# Patient Record
Sex: Male | Born: 1960 | Race: White | Hispanic: No | Marital: Married | Smoking: Former smoker
Health system: Southern US, Community
[De-identification: ages and names within clinical notes are randomized; demographics above are authoritative.]

## PROBLEM LIST (undated history)

## (undated) DIAGNOSIS — K219 Gastro-esophageal reflux disease without esophagitis: Secondary | ICD-10-CM

## (undated) DIAGNOSIS — E7800 Pure hypercholesterolemia, unspecified: Secondary | ICD-10-CM

## (undated) DIAGNOSIS — E669 Obesity, unspecified: Secondary | ICD-10-CM

## (undated) DIAGNOSIS — I10 Essential (primary) hypertension: Secondary | ICD-10-CM

## (undated) DIAGNOSIS — R079 Chest pain, unspecified: Secondary | ICD-10-CM

## (undated) DIAGNOSIS — E785 Hyperlipidemia, unspecified: Secondary | ICD-10-CM

## (undated) HISTORY — DX: Obesity, unspecified: E66.9

## (undated) HISTORY — PX: TONSILLECTOMY: SUR1361

## (undated) HISTORY — PX: HERNIA REPAIR: SHX51

## (undated) HISTORY — DX: Chest pain, unspecified: R07.9

## (undated) HISTORY — PX: TYMPANOSTOMY TUBE PLACEMENT: SHX32

## (undated) HISTORY — PX: CORONARY ARTERY BYPASS GRAFT: SHX141

## (undated) HISTORY — DX: Hyperlipidemia, unspecified: E78.5

## (undated) HISTORY — DX: Gastro-esophageal reflux disease without esophagitis: K21.9

---

## 1998-02-07 ENCOUNTER — Ambulatory Visit: Admission: RE | Admit: 1998-02-07 | Discharge: 1998-02-07 | Payer: Self-pay

## 1998-05-13 ENCOUNTER — Ambulatory Visit (HOSPITAL_BASED_OUTPATIENT_CLINIC_OR_DEPARTMENT_OTHER): Admission: RE | Admit: 1998-05-13 | Discharge: 1998-05-13 | Payer: Self-pay | Admitting: Otolaryngology

## 1999-04-20 ENCOUNTER — Encounter: Payer: Self-pay | Admitting: Cardiovascular Disease

## 1999-04-20 ENCOUNTER — Ambulatory Visit (HOSPITAL_COMMUNITY): Admission: RE | Admit: 1999-04-20 | Discharge: 1999-04-21 | Payer: Self-pay | Admitting: Cardiovascular Disease

## 1999-09-04 HISTORY — PX: CARDIAC CATHETERIZATION: SHX172

## 1999-09-14 ENCOUNTER — Inpatient Hospital Stay (HOSPITAL_COMMUNITY): Admission: EM | Admit: 1999-09-14 | Discharge: 1999-09-17 | Payer: Self-pay | Admitting: Emergency Medicine

## 1999-09-14 ENCOUNTER — Encounter: Payer: Self-pay | Admitting: Emergency Medicine

## 1999-09-15 ENCOUNTER — Encounter: Payer: Self-pay | Admitting: Cardiology

## 2002-04-03 ENCOUNTER — Encounter: Admission: RE | Admit: 2002-04-03 | Discharge: 2002-04-03 | Payer: Self-pay | Admitting: Internal Medicine

## 2002-04-03 ENCOUNTER — Encounter: Payer: Self-pay | Admitting: Internal Medicine

## 2002-12-08 ENCOUNTER — Emergency Department (HOSPITAL_COMMUNITY): Admission: AD | Admit: 2002-12-08 | Discharge: 2002-12-08 | Payer: Self-pay | Admitting: Family Medicine

## 2005-08-09 ENCOUNTER — Ambulatory Visit: Payer: Self-pay | Admitting: Internal Medicine

## 2006-05-17 ENCOUNTER — Ambulatory Visit: Payer: Self-pay | Admitting: Internal Medicine

## 2006-11-20 ENCOUNTER — Encounter: Payer: Self-pay | Admitting: Internal Medicine

## 2007-07-01 ENCOUNTER — Observation Stay (HOSPITAL_COMMUNITY): Admission: EM | Admit: 2007-07-01 | Discharge: 2007-07-03 | Payer: Self-pay | Admitting: Surgical Oncology

## 2007-07-19 ENCOUNTER — Encounter: Payer: Self-pay | Admitting: Internal Medicine

## 2007-07-31 ENCOUNTER — Ambulatory Visit: Payer: Self-pay | Admitting: Internal Medicine

## 2007-07-31 DIAGNOSIS — I2510 Atherosclerotic heart disease of native coronary artery without angina pectoris: Secondary | ICD-10-CM | POA: Insufficient documentation

## 2007-07-31 DIAGNOSIS — F411 Generalized anxiety disorder: Secondary | ICD-10-CM | POA: Insufficient documentation

## 2007-07-31 DIAGNOSIS — E785 Hyperlipidemia, unspecified: Secondary | ICD-10-CM

## 2007-07-31 DIAGNOSIS — M25519 Pain in unspecified shoulder: Secondary | ICD-10-CM

## 2007-07-31 DIAGNOSIS — I10 Essential (primary) hypertension: Secondary | ICD-10-CM | POA: Insufficient documentation

## 2007-08-02 ENCOUNTER — Telehealth: Payer: Self-pay | Admitting: Internal Medicine

## 2008-01-30 ENCOUNTER — Ambulatory Visit: Payer: Self-pay | Admitting: Internal Medicine

## 2008-01-30 LAB — CONVERTED CEMR LAB
ALT: 23 units/L (ref 0–53)
Albumin: 3.7 g/dL (ref 3.5–5.2)
Basophils Relative: 0 % (ref 0.0–3.0)
Bilirubin Urine: NEGATIVE
CO2: 27 meq/L (ref 19–32)
Calcium: 9 mg/dL (ref 8.4–10.5)
Creatinine, Ser: 0.9 mg/dL (ref 0.4–1.5)
GFR calc Af Amer: 116 mL/min
Glucose, Bld: 107 mg/dL — ABNORMAL HIGH (ref 70–99)
HCT: 47.1 % (ref 39.0–52.0)
HDL: 39.8 mg/dL (ref >39.0)
Hemoglobin: 16 g/dL (ref 13.0–17.0)
Ketones, ur: NEGATIVE mg/dL
Leukocytes, UA: NEGATIVE
Lymphocytes Relative: 34.8 % (ref 12.0–46.0)
MCHC: 34 g/dL (ref 30.0–36.0)
Monocytes Absolute: 0.7 10*3/uL (ref 0.1–1.0)
Monocytes Relative: 12.9 % — ABNORMAL HIGH (ref 3.0–12.0)
Neutro Abs: 2.6 10*3/uL (ref 1.4–7.7)
Nitrite: NEGATIVE
RBC: 5.16 M/uL (ref 4.22–5.81)
Specific Gravity, Urine: 1.025 (ref 1.000–1.03)
TSH: 1.97 microintl units/mL (ref 0.35–5.50)
Total CHOL/HDL Ratio: 2.9
Total Protein, Urine: NEGATIVE mg/dL
Total Protein: 6.3 g/dL (ref 6.0–8.3)
Triglycerides: 60 mg/dL (ref 0–149)
pH: 5.5 (ref 5.0–8.0)

## 2008-02-11 ENCOUNTER — Ambulatory Visit: Payer: Self-pay | Admitting: Internal Medicine

## 2008-02-11 DIAGNOSIS — K649 Unspecified hemorrhoids: Secondary | ICD-10-CM | POA: Insufficient documentation

## 2008-02-11 DIAGNOSIS — K921 Melena: Secondary | ICD-10-CM

## 2008-05-02 ENCOUNTER — Ambulatory Visit: Payer: Self-pay | Admitting: Gastroenterology

## 2008-05-12 ENCOUNTER — Ambulatory Visit: Payer: Self-pay | Admitting: Gastroenterology

## 2008-05-12 ENCOUNTER — Encounter: Payer: Self-pay | Admitting: Gastroenterology

## 2008-05-14 ENCOUNTER — Encounter: Payer: Self-pay | Admitting: Gastroenterology

## 2008-07-08 ENCOUNTER — Telehealth: Payer: Self-pay | Admitting: Internal Medicine

## 2008-09-22 ENCOUNTER — Ambulatory Visit: Payer: Self-pay | Admitting: Family Medicine

## 2008-09-22 DIAGNOSIS — R079 Chest pain, unspecified: Secondary | ICD-10-CM | POA: Insufficient documentation

## 2008-09-24 ENCOUNTER — Encounter: Payer: Self-pay | Admitting: Internal Medicine

## 2008-10-07 ENCOUNTER — Encounter: Payer: Self-pay | Admitting: Internal Medicine

## 2009-03-27 ENCOUNTER — Telehealth: Payer: Self-pay | Admitting: Internal Medicine

## 2009-05-27 ENCOUNTER — Encounter: Admission: RE | Admit: 2009-05-27 | Discharge: 2009-05-27 | Payer: Self-pay | Admitting: Sports Medicine

## 2009-06-03 ENCOUNTER — Encounter: Admission: RE | Admit: 2009-06-03 | Discharge: 2009-09-01 | Payer: Self-pay | Admitting: Sports Medicine

## 2009-09-22 ENCOUNTER — Encounter: Payer: Self-pay | Admitting: Internal Medicine

## 2010-01-01 ENCOUNTER — Ambulatory Visit
Admission: RE | Admit: 2010-01-01 | Discharge: 2010-01-01 | Payer: Self-pay | Source: Home / Self Care | Attending: Internal Medicine | Admitting: Internal Medicine

## 2010-01-01 DIAGNOSIS — M545 Low back pain: Secondary | ICD-10-CM

## 2010-01-25 ENCOUNTER — Ambulatory Visit: Admit: 2010-01-25 | Payer: Self-pay | Admitting: Sports Medicine

## 2010-02-04 NOTE — Progress Notes (Signed)
Summary: lorazepam  Phone Note Other Incoming Call back at fax   [phone] Summary of Call: request refill for lorazepam ----  will give 1 refill needs office visit not seen DR. Plotnikov since 02/2008/vg  Initial call taken by: Tora Perches,  March 27, 2009 2:08 PM    Prescriptions: LORAZEPAM 1 MG  TABS (LORAZEPAM) 1 by mouth two times a day as needed pain  #60 x 1   Entered by:   Tora Perches   Authorized by:   Tresa Garter MD   Signed by:   Tora Perches on 03/27/2009   Method used:   Telephoned to ...       [facility]  [address] [phone][facility] (retail)       [address] [place], [place]  [address]       Ph: [phone] or [phone]       Fax: [phone]   RxID:   [id number]

## 2010-02-04 NOTE — Assessment & Plan Note (Signed)
Summary: low back pain/cd   Vital Signs:  Patient profile:   50 year old male Height:      74 inches Weight:      296 pounds BMI:     38.14 Temp:     98.8 degrees F oral Pulse rate:   84 / minute Pulse rhythm:   regular Resp:     16 per minute BP sitting:   120 / 70  (left arm) Cuff size:   regular  Vitals Entered By: Lanier Prude, CMA(AAMA) (January 01, 2010 11:25 AM) CC: neck and Rt side LBP X 1 wk Is Patient Diabetic? No Comments pt is not taking Lortab   CC:  neck and Rt side LBP X 1 wk.  History of Present Illness: C/o B shoulder pain whe externaly rotates his shoulders (xrays were OK this past summer) C/o occasional LBP since last Fri night when he was playing hockey 7/10, now 3-4/10 - R buttock is tightening up. F/u CAD  - he is wondering if Simvastatin aggravating pains  Preventive Screening-Counseling & Management  Caffeine-Diet-Exercise     Does Patient Exercise: yes  Current Medications (verified): 1)  Aspirin 325 Mg  Tabs (Aspirin) .... Once Daily 2)  Vytorin 10-80 Mg  Tabs (Ezetimibe-Simvastatin) .... Once Daily 3)  Niaspan 1000 Mg  Cr-Tabs (Niacin (Antihyperlipidemic)) .... Once Daily 4)  Metoprolol Tartrate 50 Mg Tabs (Metoprolol Tartrate) .... Once Daily 5)  Vitamin D3 1000 Unit  Tabs (Cholecalciferol) .Marland Kitchen.. 1 By Mouth Daily 6)  Lorazepam 1 Mg  Tabs (Lorazepam) .Marland Kitchen.. 1 By Mouth Two Times A Day As Needed Pain 7)  Lortab 7.5 7.5-500 Mg Tabs (Hydrocodone-Acetaminophen) .Marland Kitchen.. 1 By Mouth Hs As Needed Pain  Allergies (verified): No Known Drug Allergies  Past History:  Past Medical History: Last updated: 02/11/2008 Anxiety Coronary artery disease, last cath 06/2007 Hyperlipidemia Hypertension Wife w/gen. 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    Physical Exam  General:  Well-developed,well-nourished,in no acute distress; alert,appropriate and cooperative throughout examination Head:  Normocephalic and atraumatic without obvious abnormalities. No apparent alopecia or balding. Mouth:  Oral mucosa and oropharynx without lesions or exudates.  Teeth in good repair. Lungs:  Normal respiratory effort, chest expands symmetrically. Lungs are clear to auscultation, no crackles or wheezes. Heart:  Normal rate and regular rhythm. S1 and S2 normal without gallop, murmur, click, rub or other extra sounds. Abdomen:  Bowel sounds positive,abdomen soft and non-tender without masses, organomegaly or hernias noted. Msk:  No deformity or scoliosis noted of thoracic or lumbar spine.  Cerv spine is ok. B shoulder with subacr pain when abducting and on ext rotating R glut is tender w/ROM and palp Neurologic:  No cranial nerve deficits noted. Station and gait are normal. Plantar reflexes are down-going bilaterally. DTRs are symmetrical throughout. Sensory, motor and coordinative functions appear intact. Skin:  Intact without suspicious lesions or rashes Psych:  Cognition and judgment appear intact. Alert and cooperative with normal attention span and concentration. No apparent delusions, illusions, hallucinations   Impression & Recommendations:  Problem # 1:  LOW BACK PAIN, ACUTE (ICD-724.2) R Assessment  New  The following medications were removed from the medication list:    Lortab 7.5 7.5-500 Mg Tabs (Hydrocodone-acetaminophen) .Marland Kitchen... 1 by mouth hs as needed pain His updated medication list for this problem includes:    Aspirin 325 Mg Tabs (Aspirin) ..... Once daily    Ibuprofen 600 Mg Tabs (Ibuprofen)  .Marland Kitchen... 1 by mouth bid  pc x 1 wk then as needed for  pain    Tramadol Hcl 50 Mg Tabs (Tramadol hcl) .Marland Kitchen... 1-2 tabs by mouth two times a day as needed pain  Orders: Sports Medicine (Sports Med)  Problem # 2:  SHOULDER PAIN (ICD-719.41) B Assessment: New  The following medications were removed from the medication list:    Lortab 7.5 7.5-500 Mg Tabs (Hydrocodone-acetaminophen) .Marland Kitchen... 1 by mouth hs as needed pain His updated medication list for this problem includes:    Aspirin 325 Mg Tabs (Aspirin) ..... Once daily    Ibuprofen 600 Mg Tabs (Ibuprofen) .Marland Kitchen... 1 by mouth bid  pc x 1 wk then as needed for  pain    Tramadol Hcl 50 Mg Tabs (Tramadol hcl) .Marland Kitchen... 1-2 tabs by mouth two times a day as needed pain  Orders: Sports Medicine (Sports Med)  Problem # 3:  HYPERLIPIDEMIA (ICD-272.4) Assessment: Comment Only  The following medications were removed from the medication list:    Vytorin 10-80 Mg Tabs (Ezetimibe-simvastatin) ..... Once daily His updated medication list for this problem includes:    Niaspan 1000 Mg Cr-tabs (Niacin (antihyperlipidemic)) ..... Once daily    Lipitor 40 Mg Tabs (Atorvastatin calcium) .Marland Kitchen... 1 by mouth once daily for cholesterol  Problem # 4:  CORONARY ARTERY DISEASE (ICD-414.00) Assessment: Unchanged  His updated medication list for this problem includes:    Aspirin 325 Mg Tabs (Aspirin) ..... Once daily    Metoprolol Tartrate 50 Mg Tabs (Metoprolol tartrate) ..... Once daily  Complete Medication List: 1)  Aspirin 325 Mg Tabs (Aspirin) .... Once daily 2)  Niaspan 1000 Mg Cr-tabs (Niacin (antihyperlipidemic)) .... Once daily 3)  Metoprolol Tartrate 50 Mg Tabs (Metoprolol tartrate) .... Once daily 4)  Vitamin D3 1000 Unit Tabs (Cholecalciferol) .Marland Kitchen.. 1 by mouth daily 5)  Lorazepam 1 Mg Tabs (Lorazepam) .Marland Kitchen.. 1 by mouth two times a day as needed pain 6)  Ibuprofen 600 Mg Tabs (Ibuprofen) .Marland Kitchen.. 1 by mouth bid  pc x 1 wk then as needed for  pain 7)  Tramadol Hcl 50  Mg Tabs (Tramadol hcl) .Marland Kitchen.. 1-2 tabs by mouth two times a day as needed pain 8)  Lipitor 40 Mg Tabs (Atorvastatin calcium) .Marland Kitchen.. 1 by mouth once daily for cholesterol  Patient Instructions: 1)  Go on Youtube (www.youtube.com) and look up "piriformis stretch" and "gluteus stretch"; also look up "rotator cuff strain". See the anatomy and learn the symptoms.  You can try to self-diagnose.Do the stretches - it may help!  2)  Please schedule a follow-up appointment in 6 weeks. Prescriptions: LIPITOR 40 MG TABS (ATORVASTATIN CALCIUM) 1 by mouth once daily for cholesterol  #30 x 12   Entered and Authorized by:   Tresa Garter MD   Signed by:   Tresa Garter MD on 01/01/2010   Method used:   [facility]        [facility] [phone][facility] (retail)       [address]       [place], [place]  [address]       Ph: [phone] or [phone]  Fax: [phone]   RxID:   [id number] TRAMADOL HCL 50 MG TABS (TRAMADOL HCL) 1-2 tabs by mouth two times a day as needed pain  #60 x 3   Entered and Authorized by:   Tresa Garter MD   Signed by:   Tresa Garter MD on 01/01/2010   Method used:   [facility]ically [facility]        [facility]  [address] [phone]* (retail)       [address]       [place], [place]  [address]       Ph: [phone] or [phone]       Fax: [phone]   RxID:   [id number] IBUPROFEN 600 MG TABS (IBUPROFEN) 1 by mouth bid  pc x 1 wk then as needed for  pain  #60 x 3   Entered and Authorized by:   Tresa Garter MD   Signed by:   Tresa Garter MD on 01/01/2010   Method used:   [facility]        [facility] [phone]* (retail)       [address]       [place], [place]  [address]       Ph: [phone] or [phone]       Fax: [phone]   RxID:   [id number]    Orders Added: 1)  Sports Medicine [Sports Med] 2)  Est. Patient Level IV [[address]]

## 2010-02-05 NOTE — Letter (Signed)
Summary: [facility]   Imported By: Sherian Rein 09/29/2009 15:00:56  _____________________________________________________________________  External Attachment:    Type:   Image     Comment:   External Document

## 2010-05-18 NOTE — Cardiovascular Report (Signed)
NAME:  MICK, TANGUMA NO.:  [id number]   MEDICAL RECORD NO.:  [id number]          PATIENT TYPE:  INP   LOCATION:  2920                         FACILITY:  [facility]   PHYSICIAN:  Richard A. Alanda Amass, M.D.DATE OF BIRTH:  1960-10-19   DATE OF PROCEDURE:  07/02/2007  DATE OF DISCHARGE:                            CARDIAC CATHETERIZATION   PROCEDURE:  Retrograde central aortic catheterization, selective  coronary angiography via Judkins technique, LV angiogram RAO/LAO  projection, saphenous vein graft angiography, selective LIMA, selective  RIMA, abdominal aortic angiogram midstream PA projection.   PROCEDURE:  The patient was brought to the second floor of [facility] in a  postabsorptive state after 5 mg Valium p.o. premedication.  The right  groin was prepped and draped in usual manner.  Xylocaine 1% was used for  local anesthesia and CRFA was entered with a single arterial puncture  using an 18-gauge thin-wall needle and 6-French short sidearm sheath  inserted without difficulty.  Diagnostic coronary angiography was done  with 6-French 4-cm taper preformed Cordis coronary and pigtail  catheters.  Saphenous vein angiography was done with the right coronary  catheter, selective RIMA of the intact RIMA was done with right coronary  catheter, selective LIMA was done with the right coronary catheter.  LV  angiogram was done in the RAO/LAO projection with pigtail catheter 25 mL  and 14 mL injected and 20 mL and 12 mL per second of nonionic high  osmolar contrast.  Pullback pressure to the CA was performed.  It showed  no gradient across the aortic valve.  Catheter was brought down about  the level of renal arteries and abdominal aortic angiogram was done in  the midstream PA projection at 20 mL and 20 mL per second with DSA  imaging.  Catheter was removed, sidearm sheath was flushed.  The patient  was transferred to the holding area for sheath removal with pressure  hemostasis.   He tolerated the procedure well and had intact right lower  extremity pulses on transfer.   PRESSURES:  LV:  140 5/6; LVEDP 18-20 mmHg.   CA:  145/80 mmHg.   There is no gradient across the aortic valve on catheter pullback.   LV angiogram in the RAO and LAO projection showed angiographic LVH of at  least a moderate degree, no mitral regurgitation and no segmental wall  motion abnormality, EF approximately 60% or greater.   Abdominal aortic angiogram in the midstream PA projection showed single  normal renal arteries, smooth infrarenal abdominal aorta with no  significant calcification, aneurysm, or obstruction.  Some iliacs were  widely patent bilaterally with no significant stenosis in the apex  appeared intact.   Fluoroscopy showed 2+ left and right coronary calcification.   The native coronary angiography:  Main left coronary was relatively  small, but normal.   The LAD had a 90% stenosis after the SP1 in its proximal third.  This  was followed by a small DX1 branch that were antegrade and bifurcated.  The LAD was functionally occluded beyond the DX1 to the junction of the  proximal mid third.  There was a small relatively thin optional diagonal branch that had 60-  70% proximal narrowing, but good flow and was a 2-mm type vessel.   Circumflex artery had a very small OM1 branch.  Two previously occluded  and distal circumflex bifurcated.   The native right coronary was a dominant vessel.  It was evident that  there was a high bifurcation of the PLA and PDA before the acute margin.  There was segmental 80%, focal 95%, and 90% stenosis of the proximal  third across the RA branch in the proximal RCA.  The remainder of vessel  had irregularities.  The PLA was widely patent and bifurcated and PDA  was seen along with retrograde filling of the RIMA graft.   The intact RIMA was widely patent and an excellent anastomosis to the  PDA, which had filled well.  There was no  backfilling of the PLA on  selective injection.   The LIMA was widely patent with excellent anastomosis to the mid LAD and  filled the LAD at the apex of the heart where it bifurcated.  There was  irregularity of the LAD, but no high-grade stenosis.  There was good  flow.   The saphenous vein graft to the large distal bifurcating marginal branch  was widely patent tortuous and there was a marked bend before the  anastomosis.  The area of prior POBA in 2001 was widely patent with  approximately 30% narrowing and good flow to the large bifurcating PDA.   DISCUSSION:  This 50 year old white married father of 2 works full-time  for [employer] in [employer].  He had remote CABG in 1996 x3 with intact LIMA,  RIMA and one vein graft to the OM.  He has done well long-term.  In  2001, he was restarted by Dr. Jenne Campus and he was felt to have a high-  grade lesion just after the insertion of the saphenous vein graft to the  marginal branch.  Attempts at passing the stent around the distal vein  graft because of its extreme then were not successful so POBA was done  of a 70-80% region.  The patient has done well since then and was  admitted on July 01, 2007, with prolonged substernal burning and  tingling in his left arm, no definite chest pain, but similar to the way  he felt in 1996.  Myocardial infarction was ruled out by serial enzymes  and EKGs.  He had exogenous obesity quit smoking in 1996 and since then  it has been under good control.  Negative Cardiolite for ischemia as an  outpatient in November 2008.   His anatomy appears relatively unchanged from previous angiography of  2001 except for some possible progression of his proximal RCA disease.  He does have jeopardized DX1 which is a relatively small branch from the  proximal LAD lesion.  He has progression of disease of the proximal vein  graft to the RCA, but a widely patent RIMA to the PDA and this graft  appears widely patent now and it was  described in the past as a critics  so may have opened up since he has had progression of disease of his  native right.  The POBA site of the DX anastomosis is intact.   I would recommend continued medical therapy for this patient and PPI to  his regimen.  Case was reviewed with Dr. Clarene Duke and I will try to review  this case with his primary cardiologist, Dr. Christiane Ha  Berry as well.   CATHETERIZATION DIAGNOSIS:  1. Coronary artery bypass graft x3 in 1996.  2. Plain old balloon angioplasty just beyond CX OM branch and OM in      2001 widely patent on this study.  3. Patent left internal mammary artery to left anterior descending.  4. Patent saphenous vein graft to obtuse marginal.  5. Patent right internal mammary artery to posterior descending      artery.  6. Progression of disease segmental proximal native right coronary      artery with predominantly supplying peripheral laser angioplasty      branch.  7. Well-preserved left ventricular function, angiographic left      ventricular hypertrophy, normal ejection fraction.  8. Normal renal arteries.  9. Exogenous obesity.  10.Remote smoker.  11.Possible upper GI etiology to chest pain.  12.Systemic hypertension.  13.Hyperlipidemia well controlled on Lopid.      Richard A. Alanda Amass, M.D.  Electronically Signed     RAW/MEDQ  D:  07/02/2007  T:  07/03/2007  Job:  [id number]   cc:   Nanetta Batty, M.D.

## 2010-05-18 NOTE — Discharge Summary (Signed)
NAME:  Shane Spence, Shane Spence NO.:  [id number]   MEDICAL RECORD NO.:  [id number]          PATIENT TYPE:  INP   LOCATION:  2920                         FACILITY:  [facility]   PHYSICIAN:  Richard A. Alanda Amass, M.D.DATE OF BIRTH:  1960-01-31   DATE OF ADMISSION:  07/01/2007  DATE OF DISCHARGE:  07/03/2007                               DISCHARGE SUMMARY   Shane Spence is a 50 year old white male patient of Dr. Nanetta Batty who  came to the hospital with complaints of chest pain.  It felt like an  indigestion or burning.  He felt that it was same as his previous  angina.  He was started on IV nitroglycerin, IV heparin, aspirin, and IV  morphine was given and he is not on a beta blocker because of low heart  rate.  He has known coronary disease with CABG in 1996 and PCI and a  stent to an SVG to his OM in 2001.  He came into the hospital.  He ruled  out for an MI.  It was decided he should undergo cardiac  catheterization.  This was performed on July 02, 2007 by Dr. Susa Griffins.  He did not have progression of disease in his grafts, in  fact his LIMA to his PDA which had been atretic in the past had better  flow.  However, he did have some progression in his dominant RCA  proximally before the anastomosis of the graft.  Please see Dr. Susa Griffins for complete details of his cath.  On July 03, 2007, he was  seen by Dr. Alanda Amass.  His blood pressure was 128/71, pulse was 59,  temperature was 97.9, O2 sats were 94%.   LABORATORY DATA:  Sodium of 143, potassium 4.3, BUN of 8, creatinine of  0.26, and his glucose was 132.  Cardiac enzymes were all negative.  His  total cholesterol was 110, LDL was 62, HDL was 34, and triglycerides  were 69.  Magnesium was 1.9.  His hemoglobin was 15.9, hematocrit 48,  WBC 6.9, and platelets were 170.  Chest x-ray showed prior CABG, a  linear density, likely a scar.   DISCHARGE DIAGNOSES:  1. Chest pain, not thought to be ischemic  related, status post cardiac      cath without any progressive disease past his grafts.  He did have      some proximal right coronary artery progressive stenosis.  However,      his left internal mammary artery to his posterior descending artery      was patent and had better flow than prior cath.  2. Normal ejection fraction of greater than 60%.  3. Probable gastroesophageal reflux disease, placed on Protonix.  4. Dyslipidemia with low HDL.  On Niaspan, combined statin, and Zetia      with LDL within goal, HDL still slightly low at 34.  5. Hypertension.  6. Sinus bradycardia, not on statins.  7. Obesity.   DISCHARGE MEDICATIONS:  1. Aspirin enteric-coated 325 mg a day.  2. Niacin 1000 mg at bedtime after his aspirin.  3. Vytorin 10/80  at bedtime.  4. Metoprolol 25 mg twice per day.  5. Multivitamin every day.  6. Protonix 40 mg a day.  7. Prilosec 20 mg a day over-the-counter.  8. Nitroglycerin 1/150 one under the tongue every 5 minutes if needed      for chest pain.   Our office will call him on appointment to see Dr. Nanetta Batty in  couple of weeks.  He should do no strenuous activity, lifting, pushing,  pulling or extended walking for a week and no driving for 1 day.  If he  has any problems, he can call our office.  He should increase his  activity slowly.      [name], N.P.       Richard A. Alanda Amass, M.D.  Electronically Signed    BB/MEDQ  D:  07/03/2007  T:  07/04/2007  Job:  [id number]   cc:   Georgina Quint. Plotnikov, MD

## 2010-05-21 NOTE — Cardiovascular Report (Signed)
Shane Spence. Shane Spence  Patient:    Shane Spence, Shane Spence                       MRN: [id number] Proc. Date: 09/15/99 Adm. Date:  [id number] Attending:  Clovis Spence CC:         Shane Spence, M.D.   Cardiac Catheterization  PROCEDURES: 1. Left heart catheterization. 2. Coronary angiography. 3. Saphenous vein graft. 4. Left internal mammary angiography. 5. Right internal mammary angiography. 6. Saphenous vein graft. 7. Saphenous vein graft to obtuse marginal.    - Percutaneous transluminal coronary balloon angioplasty.  ATTENDING FOR THE CASE:  Shane Spence, M.D.  COMPLICATIONS:  None.  INDICATIONS:  Shane Spence is a 50 year old white male patient of Shane Spence with a history of CAD status post coronary bypass surgery consisting of a LIMA to LAD, vein graft to OM, and right internal mammary to PDA.  The patient underwent cardiac catheterization in April 2001 secondary to recurrent chest pain and was found to have a 70% lesion in his OM.  He underwent a stress Cardiolite revealing ischemia in the circumflex territory.  He is now readmitted with unstable angina and is now referred for repeat cardiac catheterization.  DESCRIPTION OF PROCEDURE:  After giving informed written consent, the patient was brought to the cardiac catheterization lab where his right and left groins were shaved, prepped, and draped in the usual sterile fashion.  ECG monitoring was established using a modified Seldinger technique.  A 6-French arterial sheath was inserted in the right femoral artery.  The 6-French diagnostic catheters were then used to perform a diagnostic angiography.  RESULTS: 1. This revealed a medium sized left main with no significant disease. 2. The LAD is a medium sized vessel which is noted to have a 60% proximal    stenotic lesion and then fills to the mid segment and then fills    competitively in its mid and distal portions.  There is a  patent LIMA to    the LAD with no evidence of systemic disease in the body of the LIMA or    beyond the graft insertion. 3. The left circumflex is a medium sized vessel which coursed in the AV groove    and gave rise to two obtuse marginal branches.  The AV groove circumflex    has no significant disease.  The first OM is a medium sized vessel with a    50% proximal stenotic lesion.  There is a second OM which fills via a    saphenous vein graft which is very tortuous in its distal portion.  There    is a 70% hazy stenotic lesion beyond the graft insertion site. 4. The right coronary artery is a small vessel which is noted to be    codominant and is diffusely diseased in its proximal and mid and distal    portions.  There is 70% proximal, 70% mid, and 50% distal stenotic lesions.    The PDA is a medium sized vessel which retrograde fills a small atretic    right internal mammary graft.  There is no significant disease in the body    of the PDA. 5. The right internal mammary artery is selectively engaged and is a small    atretic vessel which does not fill the distal RCA.  A left ventriculogram reveals a preserved EF calculated at 50%.  HEMODYNAMIC DATA:  Systemic arterial pressure 130/80, LV systemic pressure  130/16, LVEDP of 22.  INTERVENTIONAL PROCEDURE:  Saphenous vein graft to OM:  Following diagnostic angiography, a 7-French LCG guiding catheter with sideholes was coaxially engaged in the saphenous vein graft to the OM.  Next, a 0.014 Patriot guide wire was advanced via the guiding catheter to the proximal vein graft.  The guide wire was then positioned in the distal OM without difficulty.  Next, a CrossSail 3.0 x 15-mm balloon was then tracked across the stenotic lesion and one subsequent inflation to a maximum of 14 atmospheres was then performed for a total of 50 seconds.  A followup angiogram revealed a small linear dissection with TIMI-3 flow to the distal vessel.  We then  attempted to track a Tetra 3.0 x 15-mm stent across the stenotic lesion; however, it would not make the severe turn in the distal portion of the vein graft.  The stent was then removed, and multiple extra-support guide wire was then positioned in the distal OM; however, the stent would not advance beyond the initial turn secondary to lack of guide support.  The stent was then removed, and a Radius 3.0 x 12-mm stent was then attempted to track into the distal portion of the OM; however, this stent would not make the turn either.  This stent was then removed, and a Tetra 3.0 x 8-mm stent was then attempted to cross; however, it would not pass into the marginal branch secondary to the acute angle.  At this point, the patient was stable, and there was TIMI-3 flow to the distal vessel.  The patient was asymptomatic.  Intravenous doses of ReoPro were given because of the complexity of the case.  IV heparin was given to maintain an ACT between 200-300.  Final orthogonal angiograms reveal less than 10% residual stenosis with a small linear dissection which appears stable.  We were unsuccessful in tracking the stent into the OM.  At this point, we elected to conclude the procedure.  All balloons, wires, and catheters were removed.  The hemostatic sheath was sewn in place.  The patient was transferred back to the ward in stable condition.  CONCLUSIONS: 1. Successful percutaneous transluminal coronary balloon angioplasty of the    saphenous vein graft to the second OM. 2. Unsuccessful deployment of intracoronary stent secondary to extreme    tortuosity of the vein graft to the OM. 3. Patent left internal mammary artery to the LAD. 4. Atretic right internal mammary artery to the distal RCA. 5. Three-vessel coronary artery disease. 6. Normal LV systolic function. 7. Adjunct use of ReoPro administration. DD:  09/15/99 TD:  09/15/99 Job: [id number]

## 2010-05-21 NOTE — Cardiovascular Report (Signed)
[place]. [facility]  Patient:    Shane Spence, Shane Spence                       MRN: [id number] Proc. Date: 04/21/99 Adm. Date:  [id number] Attending:  Berry, Shane [place] CC:         Shane Spence, M.D. - [place], [place]             Shane Spence, M.D. Shane Spence, [place]             Cardiac Catheterization Laboratory                        Cardiac Catheterization  PROCEDURE:  Coronary angiography.  CARDIOLOGIST:  Shane Spence. Shane Spence, M.D.  INDICATIONS:  Substernal chest pain, anterior wall myocardial infarction.  RESULTS: 1. Left main coronary artery:  The left main coronary artery had a 40% ostial and    a  40% midvessel lesion. 2. Left anterior descending coronary artery:  The left anterior descending    artery was subtotally occluded in the midvessel.  There were two 90%    lesions seen on either side of the first diagonal branch.  There was TIMI-3    flow to the distal vessel. 3. Circumflex coronary artery:  The circumflex coronary artery was nondominant.    There was a 70% stenosis proximally.  There was a high takeoff of a first    obtuse marginal branch.  The inferior portion of this branch had a 60%    ostial lesion.  The distal circumflex and/or second obtuse marginal    branch had a 90% discrete stenosis. 4. Right coronary artery:  The right coronary artery was somewhat difficult    to engage.  The aortic root was mildly dilated.  There appeared to be a    70% ostial stenosis with calcification.  The midvessel had 40%-50% discrete    disease.  The takeoff of the PDA had a 70% ostial lesion.  There were no    right to left collaterals. 5. Left subclavian artery:  The imaging of the left subclavian showed it to    be tortuous, but the LIMA was a good vessel for bypass.  RAO VENTRICULOGRAPHY:  The RAO ventriculography revealed anterior wall apical and inferior apical wall hypokinesis which was severe.  The basal segments were hyperdynamic.  Overall  ejection fraction is in the 45% range.  There is no mitral regurgitation.  There was a small gradient across the aortic valve.  The aortic  pressure was 111/61.  The LV pressure was 131/21.  IMPRESSION/PLAN:  Mr. Shane Spence will have an urgent percutaneous transluminal coronary angioplasty plus-minus stenting by Dr. Loraine Leriche Spence.  Once the patency of the  left anterior descending coronary artery is established, he will most likely require staged coronary artery bypass grafting within one weeks time.  We will check a 2-D echocardiogram to further assess his aortic valve and aortic sclerosis murmur.  He will also have a carotid duplex study to assess his left carotid bruit. DD:  04/21/99 TD:  04/21/99 Job: [id number]

## 2010-05-21 NOTE — Cardiovascular Report (Signed)
[place]. [facility]  Patient:    Shane Spence, Shane Spence                       MRN: [id number] Proc. Date: 04/20/99 Adm. Date:  [id number] Attending:  Berry, Jonathan [place] CC:         [facility] [address] [place]             Cardiac Catheterization Laboratory             Sonda Primes, M.D. [facility]                        Cardiac Catheterization  INDICATIONS:  The patient is a 50 year old, moderately overweight white male with a history of hyperlipidemia, remote tobacco abuse and a positive family history for heart disease.  He had coronary artery bypass grafting x 3 in September of 1996 by Dr. Kathlee Nations Trigt with a LIMA to his LAD, vein graft to the obtuse marginal branch, and a RIMA to the right coronary artery.  He denies chest pain.  A myocardiolysis perfusion study was performed on March 23 suggesting mild lateral ischemia.  He presents now for diagnostic coronary arteriography  DESCRIPTION OF PROCEDURE:  The patient was brought to the second floor [place] [facility]iac Catheterization lab in the postabsorptive state.  He was premedicated with p.o. Valium.  His right groin was prepped and shaved in the usual sterile fashion. Xylocaine 1% was used for local anesthesia.  A 6 French sheath was inserted into the right femoral artery using standard Seldinger technique.  A 6 French right nd left Judkins catheter as well, as a 6 French pigtail catheter were used for selective coronary angiography, ventriculography, subselective right and left internal mammary artery angiography, and selective vein graft angiography. A left internal mammary artery was used for both IMA injections.  Retrograde aortic, left ventricular pullback pressures were recorded.  HEMODYNAMICS: 1. Aortic systolic pressure 138, diastolic pressure 82. 2. Left ventricular systolic pressure 138, end-diastolic pressure 20.  SELECTIVE CORONARY ANGIOGRAPHY: 1. Left  main:  Normal. 2. Left anterior descending:  The LAD has segmental long 70-80% proximal stenosis    straddling the first diagonal branch. 3. Ramus intermedius:  This vessel was moderate in size.  It was widely patent.  4. Left circumflex:  The first large marginal branch was clearly occluded    proximally. 5. Right coronary artery:  This is a large dominant vessel that gave off an acute    marginal branch that was bifurcating a large.  There was 70% segmental mid    stenosis. 6. Right internal mammary artery:  This vessel appeared to be atretic. 7. LIMA to the LAD:  This vessel was widely patent. 8. Vein graft to the obtuse marginal branch:  This is a large vein graft that    was widely patent.  There was a focal 75% stenosis in the obtuse marginal branch    just after vein graft insertion.  LEFT VENTRICULOGRAPHY:  The RAO left ventriculogram was performed using 25 cc of Omnipaque dye at 20 cc per second.  The overall LVEF was estimated at greater than 50% without focal wall motion abnormalities.  OVERALL IMPRESSION:  The patient has an atretic right internal mammary artery to the right coronary artery with disease in the right coronary that does not appear hemodynamic systemic.  His left internal mammary artery is widely patent and  as is his vein graft.  I believe the 75% stenosis in the obtuse marginal branch is hemodynamically systemic and contributing to the cine-graphic abnormality on the Cardiolite stress test.  However, the patient is current asymptomatic and the vein graft is tortuous along with the lesion being on a bend making it a moderately igh risk lesion for percutaneous coronary intervention.  Plans will be for continued medical therapy.  The sheaths were removed, pressure was held on the groin to achieve hemostasis.  The patient left the lab in stable condition.  He will be discharged home in the morning.  I will see him back in he office in two weeks.  He  left the lab in stable condition. DD:  04/20/99 TD:  04/21/99 Job: [id number]

## 2010-05-21 NOTE — Discharge Summary (Signed)
[place]. [facility]  Patient:    Shane Spence, Shane Spence                       MRN: [id number] Adm. Date:  [id number] Disc. Date: [id number] Attending:  Darlin Priestly Dictator:   Darcella Gasman. Annie Paras, F.N.P.C. CC:         Runell Gess, M.D.  Sonda Primes, M.D. [facility]   Discharge Summary  DISCHARGE DIAGNOSES: 1. Unstable angina. 2. Coronary artery disease.    a. Coronary artery bypass grafting in 1996.    b. Percutaneous transluminal coronary angioplasty of the saphenous vein       graft to the second obtuse marginal.    c. Unsuccessful deployment of intercoronary stent of the vein graft to       the obtuse marginal secondary to extreme tortuosity. 3. Hypertension. 4. Hyperlipidemia. 5. Chronic obstructive pulmonary disease.  DISCHARGE CONDITION:  Improved.  PROCEDURES: 1. On September 15, 1999, combined left heart catheterization with graft    visualization by Lenise Herald, M.D. 2. On September 15, 1999, PTCA of the saphenous vein graft to the second OM.  DISCHARGE MEDICATIONS: 1. Enteric coated aspirin 325 mg daily. 2. Zocor 40 mg one in the evening. 3. Nexium 40 mg daily. 4. Plavix 75 mg one a day for 30 days. 5. Vioxx 25 mg one a day. 6. Lopressor 50 mg one half tablet twice a day. 7. Nitroglycerin sublingual p.r.n. chest pain.  DISCHARGE INSTRUCTIONS: 1. No strenuous activity. No sexual activity. No heavy lifting for four days. 2. Low fat, low salt, low cholesterol diet. 3. May shower today.  If any bleeding, swelling, or drainage of    catheterization site, please call the office. 4. See Runell Gess, M.D., in two to three weeks. Call the office for    an appointment date and time.  HISTORY OF PRESENT ILLNESS:  Mr. Shane Spence is a 50 year old married white male who presented to the ER on September 15, 1999, after vomiting for one day and left neck and arm pain.  He tried Protonix without results and Norvasc.   While in the emergency room he had left arm numbness and left hand numbness but no associated symptoms.  History of coronary artery bypass grafting in 1996 and catheterization in April of 2001. CABG x 3 with LIMA to the RCA, LIMA to the LAD, saphenous vein graft to the left circumflex. Catheterization on April 20, 1999, patent LIMA to the LAD, patent saphenous vein graft to the circumflex, stenosis in OM just after the vein graft insertion site, __________ RIMA to the RCA with 70% RCA stenosis mid vessel, normal LV function.  PAST MEDICAL HISTORY:  Cardiac as above.  Reflux and hyperlipidemia.  ALLERGIES:  No known drug allergies.  OUTPATIENT MEDICATIONS: 1. Zocor 40. 2. Aspirin daily.  For social history, family history and review of systems, see H&P.  DISCHARGE PHYSICAL EXAMINATION:  VITAL SIGNS:  Blood pressure 100 to 127/60s, pulse 55 to 68, respiratory rate 20, room air oxygen saturation 96%, temperature 98.6.  LUNGS:  Clear.  CARDIOVASCULAR:  Regular rate and rhythm with a 1/6 systolic murmur at left sternal border.  ABDOMEN:  Obese, soft and nontender.  Positive bowel sounds.  EXTREMITIES:  Without edema, 1+ dorsal pedis.  LABORATORY DATA:  Hemoglobin 15.8, hematocrit 45, wbc 7.8, MCV 85, platelets 210; neutrophils 50, lymphs 35, monocytes 8, eosinophils 4, basophils 1, leukocytes 2.  Protime 12.9, INR of  1, PTT on heparin, he was therapeutic. Sodium 134, potassium 3.9, chloride 108, CO2 28, glucose 105, BUN 13, creatinine 0.9, calcium 9.0, total protein 6, albumin 3.3, AST 16, ALT 19, ALP 67, total bilirubin 0.7.  Cardiac enzymes ranged from 115 and a low of 89, MB negative, troponin I negative.  Cholesterol is not on the chart.  It was ordered.  Chest x-ray on admission:  A portable chest revealed a 2 cm vague opacity overlying the left mid chest, questionable mass.  PA and lateral was obtained and revealed the 2 cm left lung opacity seen September 14, 1999, is  no longer demonstrated.  COPD is present and stable.  No acute abnormalities.  Cervical spine showed mild degenerative changes.  EKG on admission showed sinus rhythm, normal EKG.  Follow-up remained essentially unchanged.  Heart catheterization:  September 15, 1999, combined left heart catheterization with graft visualization. The patient underwent PTCA to saphenous vein graft to second OM but unsuccessful stent.  Normal LV systolic function.  The patient tolerated the procedure well. EF was 50%.  HOSPITAL COURSE:  Mr. Shane Spence was admitted September 14, 1999, with unstable angina with neck pain.  He underwent angioplasty to the vein graft to the OM. Stent was unable to be deployed.  Initially admitted on Lovenox and nitroglycerin.  Protonix, Toradol, and beta-blocker were started and he was placed on telemetry.  He underwent catheterization as stated.  Please see above.  By September 16, 1999, he was ready for discharge.  He will follow up with Dr. Allyson Sabal as an outpatient. DD:  10/24/99 TD:  10/25/99 Job: [id number]

## 2010-07-06 ENCOUNTER — Other Ambulatory Visit: Payer: Self-pay | Admitting: *Deleted

## 2010-07-06 MED ORDER — METOPROLOL TARTRATE 25 MG PO TABS: 25.0000 mg | ORAL_TABLET | Freq: Two times a day (BID) | ORAL | Status: DC

## 2010-09-30 LAB — BASIC METABOLIC PANEL
BUN: 13
CO2: 25
CO2: 26
Calcium: 8.8
Chloride: 108
Creatinine, Ser: 0.9
Creatinine, Ser: 0.96
Glucose, Bld: 132 — ABNORMAL HIGH
Sodium: 143

## 2010-09-30 LAB — POCT I-STAT, CHEM 8
Calcium, Ion: 1.29
Chloride: 107
HCT: 51
Sodium: 141

## 2010-09-30 LAB — PROTIME-INR
INR: 0.9
Prothrombin Time: 12.8

## 2010-09-30 LAB — CK TOTAL AND CKMB (NOT AT ARMC): Total CK: 128

## 2010-09-30 LAB — DIFFERENTIAL
Eosinophils Absolute: 0.2
Lymphocytes Relative: 33
Lymphs Abs: 2.5
Neutro Abs: 3.9
Neutrophils Relative %: 52

## 2010-09-30 LAB — CARDIAC PANEL(CRET KIN+CKTOT+MB+TROPI)
CK, MB: 1.4
Relative Index: INVALID
Total CK: 102
Total CK: 102
Total CK: 97
Troponin I: 0.02

## 2010-09-30 LAB — CBC
MCHC: 33.1
MCV: 89.6
MCV: 89.8
Platelets: 170
Platelets: 185
WBC: 6.9
WBC: 7.5

## 2010-09-30 LAB — POCT CARDIAC MARKERS
Operator id: 277751
Troponin i, poc: <0.05

## 2010-09-30 LAB — LIPID PANEL
Cholesterol: 110
LDL Cholesterol: 62

## 2011-02-03 ENCOUNTER — Other Ambulatory Visit: Payer: Self-pay | Admitting: *Deleted

## 2011-02-03 ENCOUNTER — Other Ambulatory Visit: Payer: Self-pay | Admitting: Internal Medicine

## 2011-02-03 MED ORDER — METOPROLOL TARTRATE 25 MG PO TABS: 25.0000 mg | ORAL_TABLET | Freq: Two times a day (BID) | ORAL | Status: DC

## 2011-02-04 ENCOUNTER — Other Ambulatory Visit: Payer: Self-pay | Admitting: *Deleted

## 2011-02-04 MED ORDER — ATORVASTATIN CALCIUM 40 MG PO TABS: 40.0000 mg | ORAL_TABLET | Freq: Every day | ORAL | Status: AC

## 2011-02-09 ENCOUNTER — Other Ambulatory Visit: Payer: Self-pay | Admitting: Internal Medicine

## 2011-06-03 ENCOUNTER — Other Ambulatory Visit: Payer: Self-pay | Admitting: Internal Medicine

## 2011-07-08 ENCOUNTER — Emergency Department
Admission: EM | Admit: 2011-07-08 | Discharge: 2011-07-08 | Disposition: A | Discharge status: Home Health | Payer: Self-pay | Source: Home / Self Care | Attending: Family Medicine | Admitting: Family Medicine

## 2011-07-08 DIAGNOSIS — J069 Acute upper respiratory infection, unspecified: Secondary | ICD-10-CM

## 2011-07-08 DIAGNOSIS — H6690 Otitis media, unspecified, unspecified ear: Secondary | ICD-10-CM

## 2011-07-08 DIAGNOSIS — H6693 Otitis media, unspecified, bilateral: Secondary | ICD-10-CM

## 2011-07-08 HISTORY — DX: Essential (primary) hypertension: I10

## 2011-07-08 HISTORY — DX: Pure hypercholesterolemia, unspecified: E78.00

## 2011-07-08 MED ORDER — AMOXICILLIN 875 MG PO TABS: 875.0000 mg | ORAL_TABLET | Freq: Two times a day (BID) | ORAL | Status: AC

## 2011-07-08 MED ORDER — BENZONATATE 200 MG PO CAPS: 200.0000 mg | ORAL_CAPSULE | Freq: Every day | ORAL | Status: AC

## 2011-07-08 NOTE — ED Provider Notes (Signed)
History     CSN: [id number]  Arrival date & time 07/08/11  1226   First MD Initiated Contact with Patient 07/08/11 1244      Chief Complaint  Patient presents with  . URI      HPI Comments: Patient complains of approximately 3 day history of gradually progressive URI symptoms beginning with a mild sore throat (now improved), followed by progressive nasal congestion and cough.  Complains of fatigue and initial myalgias.  Cough is now worse at night and generally non-productive during the day.  There has been no pleuritic pain, shortness of breath, or wheezes.  He has developed drainage from his right ear, and has a history of recurring otitis media.  Patient is a 51 y.o. male presenting with URI. The history is provided by the patient.  URI The primary symptoms include fatigue, headaches, ear pain, sore throat, swollen glands, cough and myalgias. Primary symptoms do not include fever, wheezing, abdominal pain, nausea, vomiting, arthralgias or rash. The current episode started 3 to 5 days ago. This is a new problem. The problem has been gradually worsening.  Symptoms associated with the illness include chills, plugged ear sensation, sinus pressure, congestion and rhinorrhea. The illness is not associated with facial pain.    Past Medical History  Diagnosis Date  . Hypertension   . Hypercholesteremia     Past Surgical History  Procedure Date  . Coronary artery bypass graft   . Tympanostomy tube placement     No family history on file.  History  Substance Use Topics  . Smoking status: Former Games developer  . Smokeless tobacco: Not on file  . Alcohol Use: Yes      Review of Systems  Constitutional: Positive for chills and fatigue. Negative for fever.  HENT: Positive for ear pain, congestion, sore throat, rhinorrhea and sinus pressure.   Respiratory: Positive for cough. Negative for wheezing.   Gastrointestinal: Negative for nausea, vomiting and abdominal pain.  Musculoskeletal:  Positive for myalgias. Negative for arthralgias.  Skin: Negative for rash.  Neurological: Positive for headaches.  All other systems reviewed and are negative.    Allergies  Review of patient's allergies indicates no known allergies.  Home Medications   Current Outpatient Rx  Name Route Sig Dispense Refill  . ASPIRIN 325 MG PO TABS Oral Take 325 mg by mouth daily.    Marland Kitchen NIACIN ER (ANTIHYPERLIPIDEMIC) 1000 MG PO TBCR Oral Take 1,000 mg by mouth at bedtime.    . AMOXICILLIN 875 MG PO TABS Oral Take 1 tablet (875 mg total) by mouth 2 (two) times daily. 20 tablet 0  . ATORVASTATIN CALCIUM 40 MG PO TABS Oral Take 1 tablet (40 mg total) by mouth daily. 30 tablet 2  . BENZONATATE 200 MG PO CAPS Oral Take 1 capsule (200 mg total) by mouth at bedtime. Take as needed for cough 12 capsule 0  . METOPROLOL TARTRATE 25 MG PO TABS Oral Take 1 tablet (25 mg total) by mouth 2 (two) times daily. 180 tablet 0    Needs Office visit for further refills.    BP 143/85  Pulse 65  Temp 98.6 F (37 C) (Oral)  Resp 16  Ht 6\' 2"  (1.88 m)  Wt 300 lb (136.079 kg)  BMI 38.52 kg/m2  SpO2 97%  Physical Exam Nursing notes and Vital Signs reviewed. Appearance:  Patient appears stated age, and in no acute distress.  Patient is obese (BMI 38.6) Eyes:  Pupils are equal, round, and reactive to light  and accomodation.  Extraocular movement is intact.  Conjunctivae are not inflamed  Ears:  Canals normal.  Tympanic membranes are erythematous bilaterally Nose:  Mildly congested turbinates.  No sinus tenderness.   Pharynx:  Normal Neck:  Supple.  Tender shotty posterior nodes are palpated bilaterally  Lungs:  Clear to auscultation.  Breath sounds are equal.  Heart:  Regular rate and rhythm without murmurs, rubs, or gallops.  Abdomen:  Nontender without masses or hepatosplenomegaly.  Bowel sounds are present.  No CVA or flank tenderness.  Extremities:  No edema.  No calf tenderness Skin:  No rash present.   ED  Course  Procedures  none  Labs Reviewed - No data to display No results found.   1. Bilateral otitis media   2. Acute upper respiratory infections of unspecified site       MDM  Begin amoxicillin.  Prescription written for Benzonatate [facility]) to take at bedtime for night-time cough.  Take Mucinex D (guaifenesin with decongestant) twice daily for congestion.  Increase fluid intake, rest. May use Afrin nasal spray (or generic oxymetazoline) twice daily for about 5 days.  Also recommend using saline nasal spray several times daily and saline nasal irrigation (AYR is a common brand) Stop all antihistamines for now, and other non-prescription cough/cold preparations. Followup with ENT if not improving.         Lattie Haw, MD 07/11/11 1027

## 2011-07-08 NOTE — ED Notes (Signed)
Pt c/o sore throat, productive cough (yellow sputum) and drainage from R ear.  Onset 2 days ago.  Pt states he does have a tube in one ear but unaware of which one.  Pt sees Dr. Jac Canavan, ENT.

## 2011-07-14 ENCOUNTER — Telehealth: Payer: Self-pay

## 2011-07-14 NOTE — ED Notes (Signed)
I called and spoke with patient and he is doing better. I advised to call back if anything changes or if he has questions or concerns.  

## 2011-09-13 IMAGING — CR DG RIBS W/ CHEST 3+V*L*
4 series · 4 of 4 positions shown · non-contrast
Comparison: None

CLINICAL DATA: Left anterior rib pain.  Plays Pi.  No known
injury.

LEFT RIBS AND CHEST - 3+ VIEW

[view not recorded (1 of 4)]
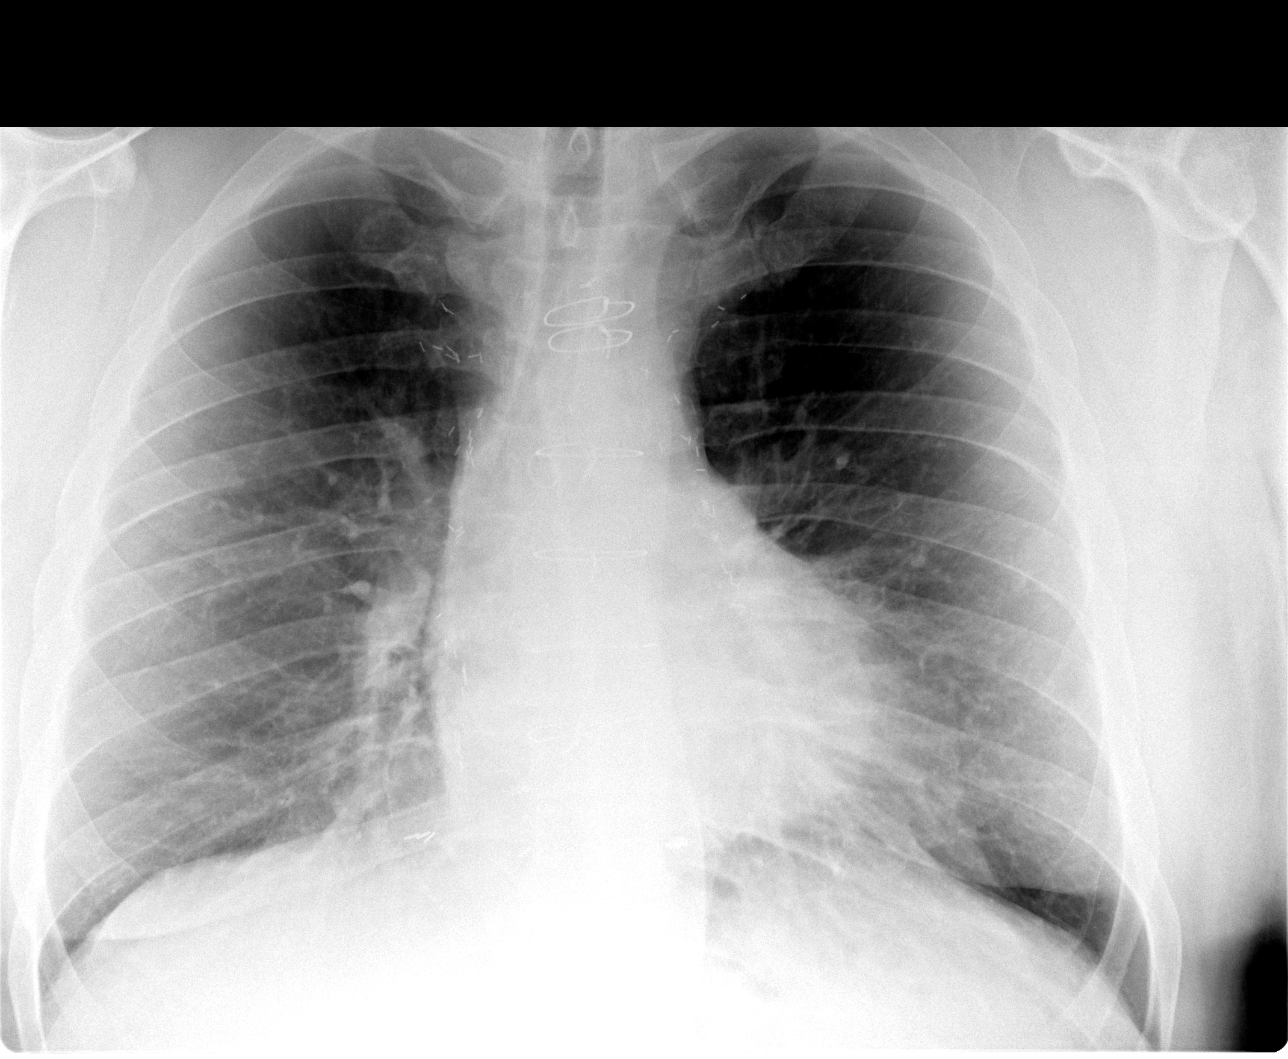

[view not recorded (2 of 4)]
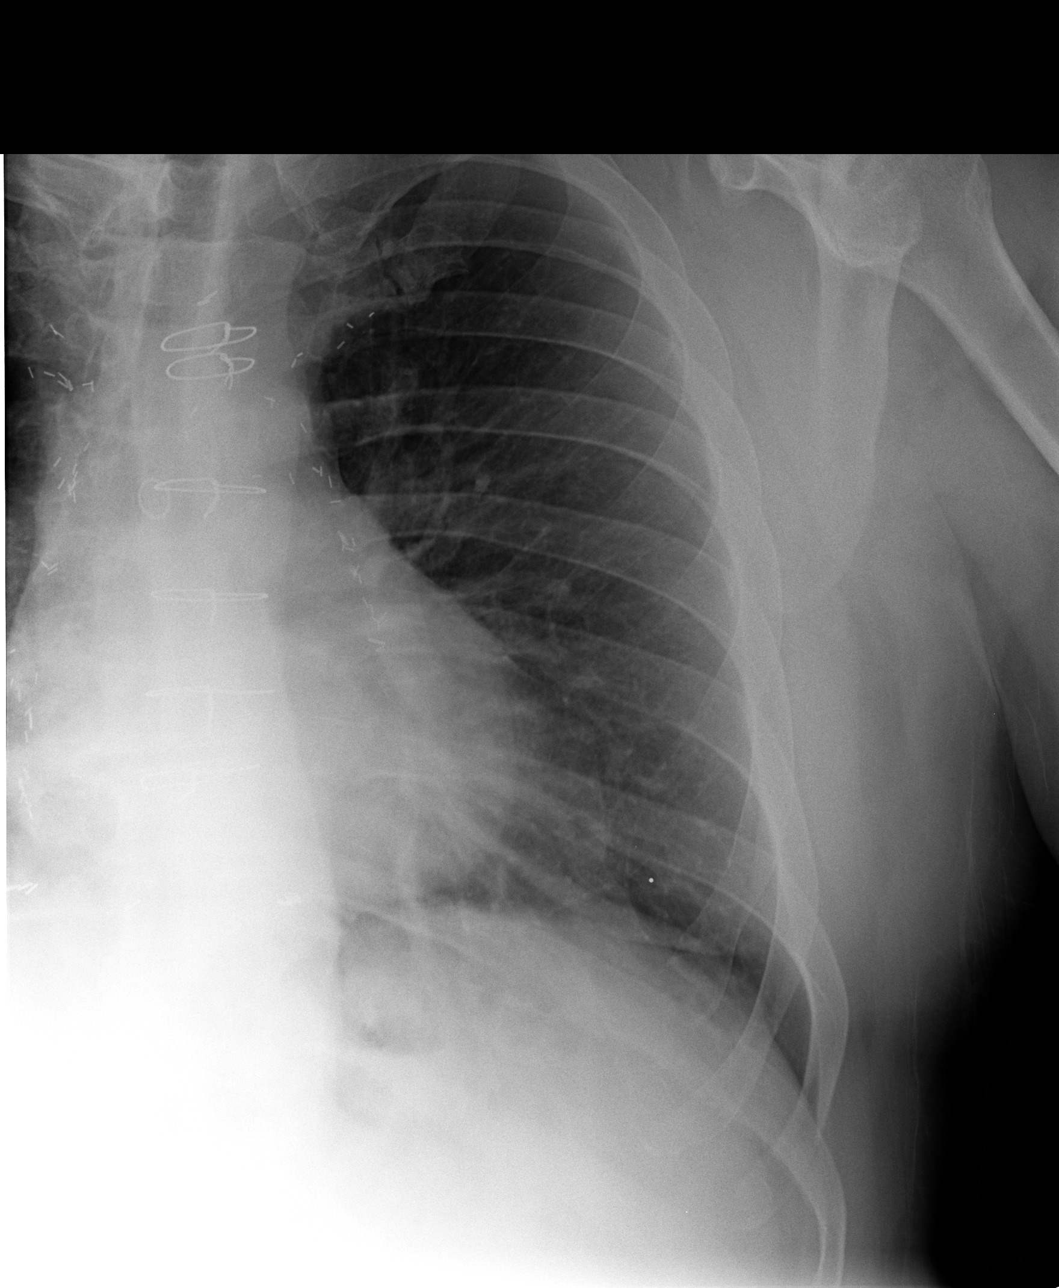

[view not recorded (3 of 4)]
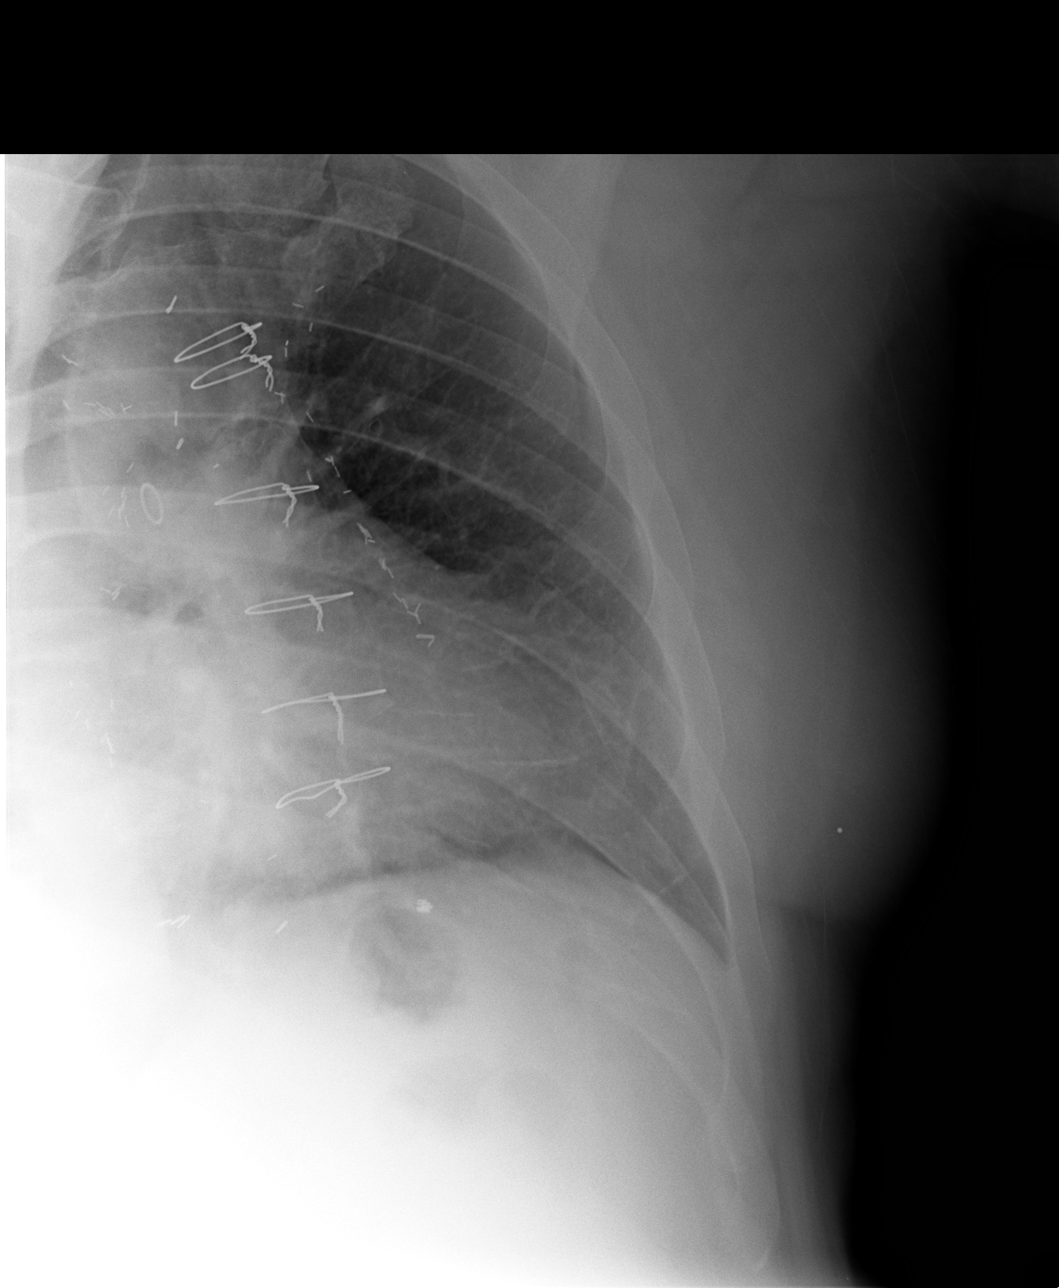

[view not recorded (4 of 4)]
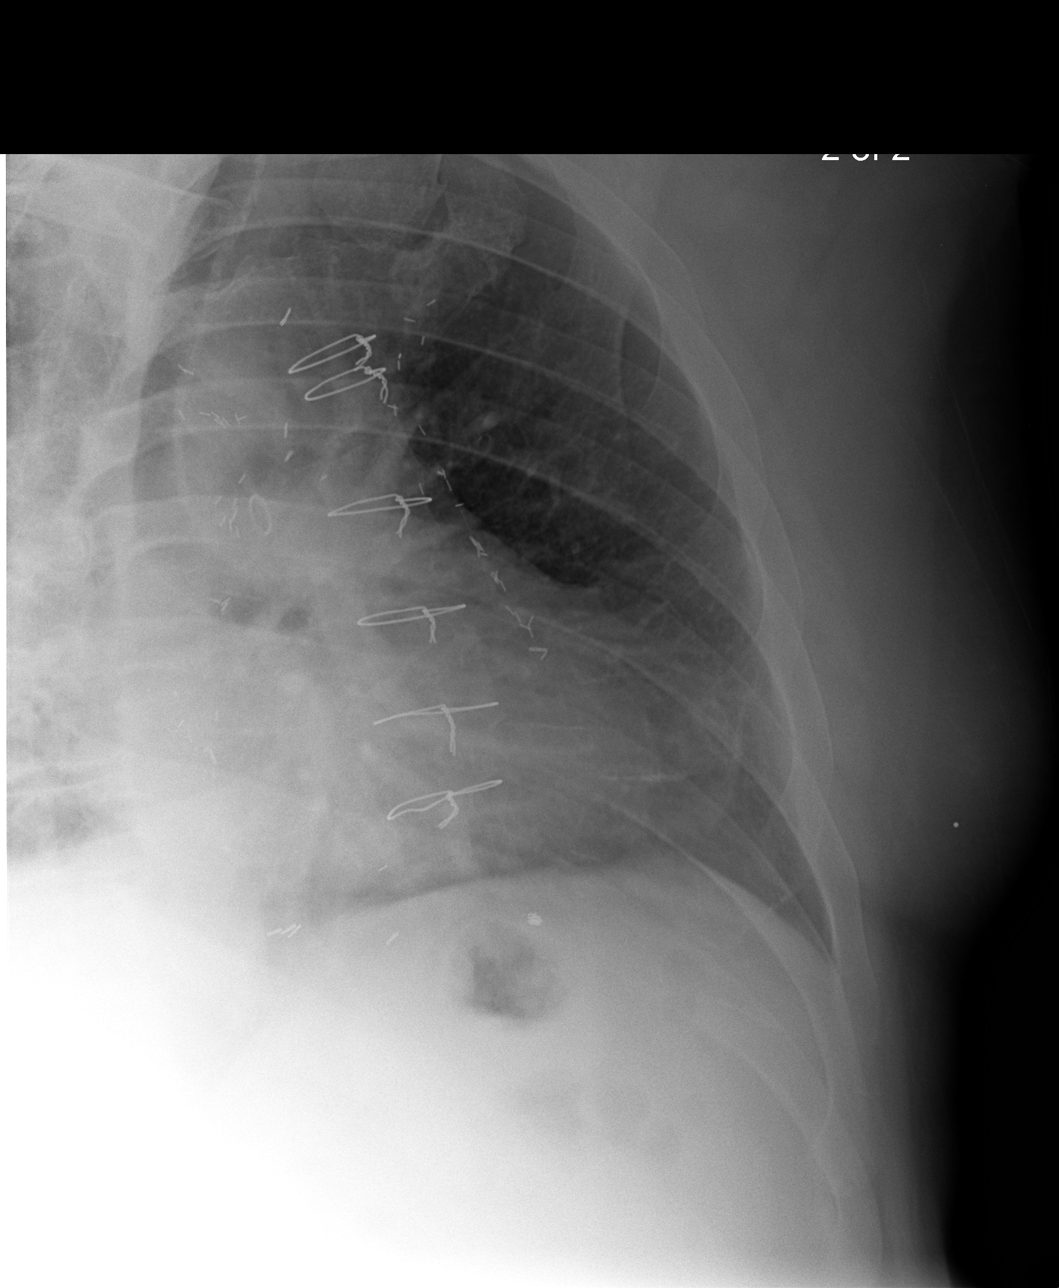

[4 of 4 positions shown; findings below may reference images not displayed]

FINDINGS: No fracture or bony displacement.  Negative for active
cardiopulmonary disease.  Sternal wire sutures and mediastinal
clips.
IMPRESSION: Negative chest and left rib detail.

## 2012-01-10 ENCOUNTER — Emergency Department
Admission: EM | Admit: 2012-01-10 | Discharge: 2012-01-10 | Disposition: A | Discharge status: Home / Self Care | Payer: Self-pay | Source: Home / Self Care | Attending: Family Medicine | Admitting: Family Medicine

## 2012-01-10 ENCOUNTER — Encounter: Payer: Self-pay | Admitting: *Deleted

## 2012-01-10 DIAGNOSIS — H60399 Other infective otitis externa, unspecified ear: Secondary | ICD-10-CM

## 2012-01-10 DIAGNOSIS — H6690 Otitis media, unspecified, unspecified ear: Secondary | ICD-10-CM

## 2012-01-10 DIAGNOSIS — H6090 Unspecified otitis externa, unspecified ear: Secondary | ICD-10-CM

## 2012-01-10 MED ORDER — NEOMYCIN-POLYMYXIN-HC 3.5-10000-1 OT SOLN: 3.0000 [drp] | Freq: Four times a day (QID) | OTIC | Status: AC

## 2012-01-10 MED ORDER — AZITHROMYCIN 250 MG PO TABS: ORAL_TABLET | ORAL | Status: AC

## 2012-01-10 NOTE — ED Provider Notes (Signed)
History     CSN: [id number]  Arrival date & time 01/10/12  1048   First MD Initiated Contact with Patient 01/10/12 1059      Chief Complaint  Patient presents with  . Otalgia   HPI  EAR PAIN Location:  Bilateral; L>R Description: bilateral ear pain and decreased hearing. No fever.  Onset:  2-3 days  Modifying factors: prior hx/o recurrent ear infections. Followed by ENT. Has ear tube in R ear.   Symptoms  Sensation of fullness: yes Ear discharge: no URI symptoms: mild  Fever: no Tinnitus:no   Dizziness:no   Hearing loss:yes   Toothache: no Rashes or lesions: no Facial muscle weakness: no  Red Flags Recent trauma: no PMH prior ear surgery:  no Diabetes or Immunosuppresion: no    Past Medical History  Diagnosis Date  . Hypertension   . Hypercholesteremia     Past Surgical History  Procedure Date  . Coronary artery bypass graft   . Tympanostomy tube placement   . Tonsillectomy   . Hernia repair     Family History  Problem Relation Age of Onset  . Heart failure Father   . Heart failure Brother     History  Substance Use Topics  . Smoking status: Former Games developer  . Smokeless tobacco: Not on file  . Alcohol Use: Yes      Review of Systems  All other systems reviewed and are negative.    Allergies  Review of patient's allergies indicates no known allergies.  Home Medications   Current Outpatient Rx  Name  Route  Sig  Dispense  Refill  . ASPIRIN 325 MG PO TABS   Oral   Take 325 mg by mouth daily.         . ATORVASTATIN CALCIUM 40 MG PO TABS   Oral   Take 1 tablet (40 mg total) by mouth daily.   30 tablet   2   . AZITHROMYCIN 250 MG PO TABS      Take 2 tabs PO x 1 dose, then 1 tab PO QD x 4 days   6 tablet   0   . METOPROLOL TARTRATE 25 MG PO TABS   Oral   Take 1 tablet (25 mg total) by mouth 2 (two) times daily.   180 tablet   0     Needs Office visit for further refills.   . NEOMYCIN-POLYMYXIN-HC 3.5-10000-1 OT SOLN  Left Ear   Place 3 drops into the left ear 4 (four) times daily.   10 mL   0   . NIACIN ER (ANTIHYPERLIPIDEMIC) 1000 MG PO TBCR   Oral   Take 1,000 mg by mouth at bedtime.           BP 149/87  Pulse 64  Temp 98.6 F (37 C) (Oral)  Resp 18  Ht 6\' 2"  (1.88 m)  Wt 305 lb (138.347 kg)  BMI 39.16 kg/m2  SpO2 98%  Physical Exam  Constitutional: He appears well-developed and well-nourished.  HENT:  Head: Normocephalic and atraumatic.       R ear ET tube in place L ear canal erythema and tenderness to otoscopic evaluation  + L TM bulging  Mild +nasal erythema, rhinorrhea bilaterally, + post oropharyngeal erythema     Eyes: Conjunctivae normal are normal. Pupils are equal, round, and reactive to light.  Neck: Normal range of motion. Neck supple.  Cardiovascular: Normal rate, regular rhythm and normal heart sounds.   Pulmonary/Chest: Effort normal and breath  sounds normal.  Abdominal: Soft.  Musculoskeletal: Normal range of motion.  Neurological: He is alert.  Skin: Skin is warm.    ED Course  Procedures (including critical care time)  Labs Reviewed - No data to display No results found.   1. Otitis externa   2. Otitis media       MDM  Will treat with zpak and cortisporin. Discussed ENT and infectious red flags.  Follow up with ENT if this persists.     The patient and/or caregiver has been counseled thoroughly with regard to treatment plan and/or medications prescribed including dosage, schedule, interactions, rationale for use, and possible side effects and they verbalize understanding. Diagnoses and expected course of recovery discussed and will return if not improved as expected or if the condition worsens. Patient and/or caregiver verbalized understanding.             Doree Albee, MD 01/10/12 1110

## 2012-01-10 NOTE — ED Notes (Signed)
Pt c/o bilateral ear ache x 2 days. He has instilled ciprodex otic gtts with no relief. Denies fever.

## 2012-04-27 ENCOUNTER — Telehealth (HOSPITAL_COMMUNITY): Payer: Self-pay | Admitting: *Deleted

## 2012-05-08 ENCOUNTER — Telehealth (HOSPITAL_COMMUNITY): Payer: Self-pay | Admitting: *Deleted

## 2012-05-15 ENCOUNTER — Telehealth (HOSPITAL_COMMUNITY): Payer: Self-pay | Admitting: *Deleted

## 2012-05-16 ENCOUNTER — Ambulatory Visit: Payer: Commercial Managed Care - PPO | Admitting: Cardiovascular Disease

## 2012-05-23 ENCOUNTER — Telehealth (HOSPITAL_COMMUNITY): Payer: Self-pay | Admitting: *Deleted

## 2012-06-07 ENCOUNTER — Telehealth (HOSPITAL_COMMUNITY): Payer: Self-pay | Admitting: *Deleted

## 2012-12-18 ENCOUNTER — Other Ambulatory Visit: Payer: Self-pay | Admitting: *Deleted

## 2012-12-18 MED ORDER — METOPROLOL TARTRATE 25 MG PO TABS: 25.0000 mg | ORAL_TABLET | Freq: Two times a day (BID) | ORAL | Status: AC

## 2014-04-16 ENCOUNTER — Telehealth: Payer: Self-pay | Admitting: *Deleted

## 2014-04-16 NOTE — Telephone Encounter (Signed)
[place] [facility] faxed Dr Shane Spence a letter concerning [name]'s Niacin.  They would like to discontinue the Niacin due to lack of supporting evidence. Dr Shane Spence authorized the discontinuation.  I faxed the form back to the [facility] program.

## 2014-09-30 ENCOUNTER — Encounter: Payer: Self-pay | Admitting: Cardiovascular Disease

## 2015-03-31 ENCOUNTER — Encounter: Payer: Self-pay | Admitting: Gastroenterology

## 2018-04-17 ENCOUNTER — Encounter: Payer: Self-pay | Admitting: Gastroenterology

## 2018-12-26 NOTE — Telephone Encounter (Signed)
Made in error

## 2018-12-26 NOTE — Telephone Encounter (Signed)
completed
